# Patient Record
Sex: Female | Born: 1965 | Hispanic: Yes | Marital: Single
Health system: Southern US, Community
[De-identification: ages and names within clinical notes are randomized; demographics above are authoritative.]

---

## 2019-11-21 ENCOUNTER — Ambulatory Visit: Payer: Self-pay | Attending: Internal Medicine

## 2019-11-21 ENCOUNTER — Other Ambulatory Visit: Payer: Self-pay

## 2019-11-21 DIAGNOSIS — Z23 Encounter for immunization: Secondary | ICD-10-CM

## 2019-11-21 NOTE — Progress Notes (Signed)
   Covid-19 Vaccination Clinic  Name:  Vicki Roberson    MRN: [id number] DOB: 1966/05/11  11/21/2019  Ms. Vicki Roberson was observed post Covid-19 immunization for 15 minutes without incident. She was provided with Vaccine Information Sheet and instruction to access the V-Safe system.   Ms. Vicki Roberson was instructed to call 911 with any severe reactions post vaccine: Marland Kitchen Difficulty breathing  . Swelling of face and throat  . A fast heartbeat  . A bad rash all over body  . Dizziness and weakness   Immunizations Administered    Name Date Dose VIS Date Route   Pfizer COVID-19 Vaccine 11/21/2019  9:48 AM 0.3 mL 07/19/2019 Intramuscular   Manufacturer: [employer], [employer]   Lot: [id number]   NDC: [address]-0

## 2019-12-18 ENCOUNTER — Ambulatory Visit: Payer: Self-pay | Attending: Internal Medicine

## 2019-12-18 DIAGNOSIS — Z23 Encounter for immunization: Secondary | ICD-10-CM

## 2019-12-18 NOTE — Progress Notes (Signed)
   Covid-19 Vaccination Clinic  Name:  Vicki Roberson    MRN: [id number] DOB: 1965-08-27  12/18/2019  Ms. Vicki Roberson was observed post Covid-19 immunization for 15 minutes without incident. She was provided with Vaccine Information Sheet and instruction to access the V-Safe system.   Ms. Vicki Roberson was instructed to call 911 with any severe reactions post vaccine: Marland Kitchen Difficulty breathing  . Swelling of face and throat  . A fast heartbeat  . A bad rash all over body  . Dizziness and weakness   Immunizations Administered    Name Date Dose VIS Date Route   Pfizer COVID-19 Vaccine 12/18/2019  9:13 AM 0.3 mL 10/02/2018 Intramuscular   Manufacturer: [employer], [employer]   Lot: [id number]   NDC: [address]-5

## 2020-05-19 ENCOUNTER — Encounter: Payer: Self-pay | Admitting: *Deleted

## 2020-05-19 ENCOUNTER — Other Ambulatory Visit: Payer: Self-pay

## 2020-05-19 ENCOUNTER — Ambulatory Visit
Admission: RE | Admit: 2020-05-19 | Discharge: 2020-05-19 | Disposition: A | Discharge status: Home / Self Care | Payer: Self-pay | Source: Ambulatory Visit | Attending: Oncology | Admitting: Oncology

## 2020-05-19 ENCOUNTER — Ambulatory Visit: Payer: Self-pay | Attending: Oncology | Admitting: *Deleted

## 2020-05-19 VITALS — BP 151/76 | HR 64 | Temp 98.6°F | Ht 63.16 in | Wt 174.4 lb

## 2020-05-19 DIAGNOSIS — Z0000 Encounter for general adult medical examination without abnormal findings: Secondary | ICD-10-CM

## 2020-05-19 NOTE — Patient Instructions (Signed)
Gave patient hand-out, Women Staying Healthy, Active and Well from [facility], with education on breast health, pap smears, heart and colon health. 

## 2020-05-19 NOTE — Progress Notes (Signed)
  Subjective:     Patient ID: Vicki Roberson, female   DOB: 1965-11-15, 54 y.o.   MRN: [id number]  HPI   [facility] Medical History Record - 05/19/20 1029      Breast History   Screening cycle New    Provider ([facility]) [employer]    Initial Mammogram 05/19/20    Last Mammogram Annual    Last Mammogram Date --   12 years ago   Provider (Mammogram)  [facility]    Recent Breast Symptoms None      Breast Cancer History   Breast Cancer History No personal or family history      Previous History of Breast Problems   Breast Surgery or Biopsy None    Breast Implants N/A    BSE Done Monthly      Gynecological/Obstetrical History   LMP --   7 years ago   Is there any chance that the client could be pregnant?  No    Age at menarche 66    Age at menopause 54    PAP smear history --   15 years ago   Provider (PAP) Plans to see primary provider for pap    Age at first live birth nulliparous    DES Exposure Unkown    Cervical, Uterine or Ovarian cancer No    Family history of Cervial, Uterine or Ovarian cancer No    Hysterectomy No    Cervix removed No    Ovaries removed No    Laser/Cryosurgery No    Current method of birth control None    Current method of Estrogen/Hormone replacement None    Smoking history None             Review of Systems     Objective:   Physical Exam Chest:     Breasts:        Right: No swelling, bleeding, inverted nipple, mass, nipple discharge, skin change or tenderness.        Left: No swelling, bleeding, inverted nipple, mass, nipple discharge, skin change or tenderness.  Lymphadenopathy:     Upper Body:     Right upper body: No supraclavicular or axillary adenopathy.     Left upper body: No supraclavicular or axillary adenopathy.        Assessment:     54 year old Hispanic patient female referred to [facility] from [facility] for clinical breast and mammogram.  Claretha Cooper, the interpreter present during the interview  and exam.  Clinical breast exam unremarkable.  Taught self breast awareness.  Patient states she is not prepared to have her pap smear today, but she will get it done at the Clinic in [place].  Patient has been screened for eligibility.  She does not have any insurance, Medicare or Medicaid.  She also meets financial eligibility.   Risk Assessment    Risk Scores      05/19/2020   Last edited by: Scarlett Presto, RN   5-year risk: 0.9 %   Lifetime risk: 6.4 %            Plan:     Screening mammogram ordered.  Will follow up per [facility] protocol.

## 2020-05-22 ENCOUNTER — Other Ambulatory Visit: Payer: Self-pay | Admitting: *Deleted

## 2020-05-22 DIAGNOSIS — N63 Unspecified lump in unspecified breast: Secondary | ICD-10-CM

## 2020-06-04 ENCOUNTER — Ambulatory Visit
Admission: RE | Admit: 2020-06-04 | Discharge: 2020-06-04 | Disposition: A | Discharge status: Home / Self Care | Payer: Self-pay | Source: Ambulatory Visit | Attending: Oncology | Admitting: Oncology

## 2020-06-04 ENCOUNTER — Other Ambulatory Visit: Payer: Self-pay

## 2020-06-04 ENCOUNTER — Other Ambulatory Visit: Payer: Self-pay | Admitting: *Deleted

## 2020-06-04 DIAGNOSIS — N63 Unspecified lump in unspecified breast: Secondary | ICD-10-CM

## 2020-06-10 ENCOUNTER — Ambulatory Visit
Admission: RE | Admit: 2020-06-10 | Discharge: 2020-06-10 | Disposition: A | Discharge status: Home / Self Care | Payer: Self-pay | Source: Ambulatory Visit | Attending: Oncology | Admitting: Oncology

## 2020-06-10 ENCOUNTER — Other Ambulatory Visit: Payer: Self-pay

## 2020-06-10 DIAGNOSIS — N63 Unspecified lump in unspecified breast: Secondary | ICD-10-CM

## 2020-06-10 HISTORY — PX: BREAST BIOPSY: SHX20

## 2020-06-11 LAB — SURGICAL PATHOLOGY

## 2021-09-28 IMAGING — MG DIGITAL DIAGNOSTIC UNILAT LEFT W/ CAD
4 series · 4 of 12 positions shown · non-contrast
Comparison: Previous exam(s).

CLINICAL DATA: 54-year-old female status post ultrasound-guided
biopsy of the left breast.

EXAM:
DIAGNOSTIC LEFT MAMMOGRAM POST ULTRASOUND BIOPSY

[L ML synth-2D]
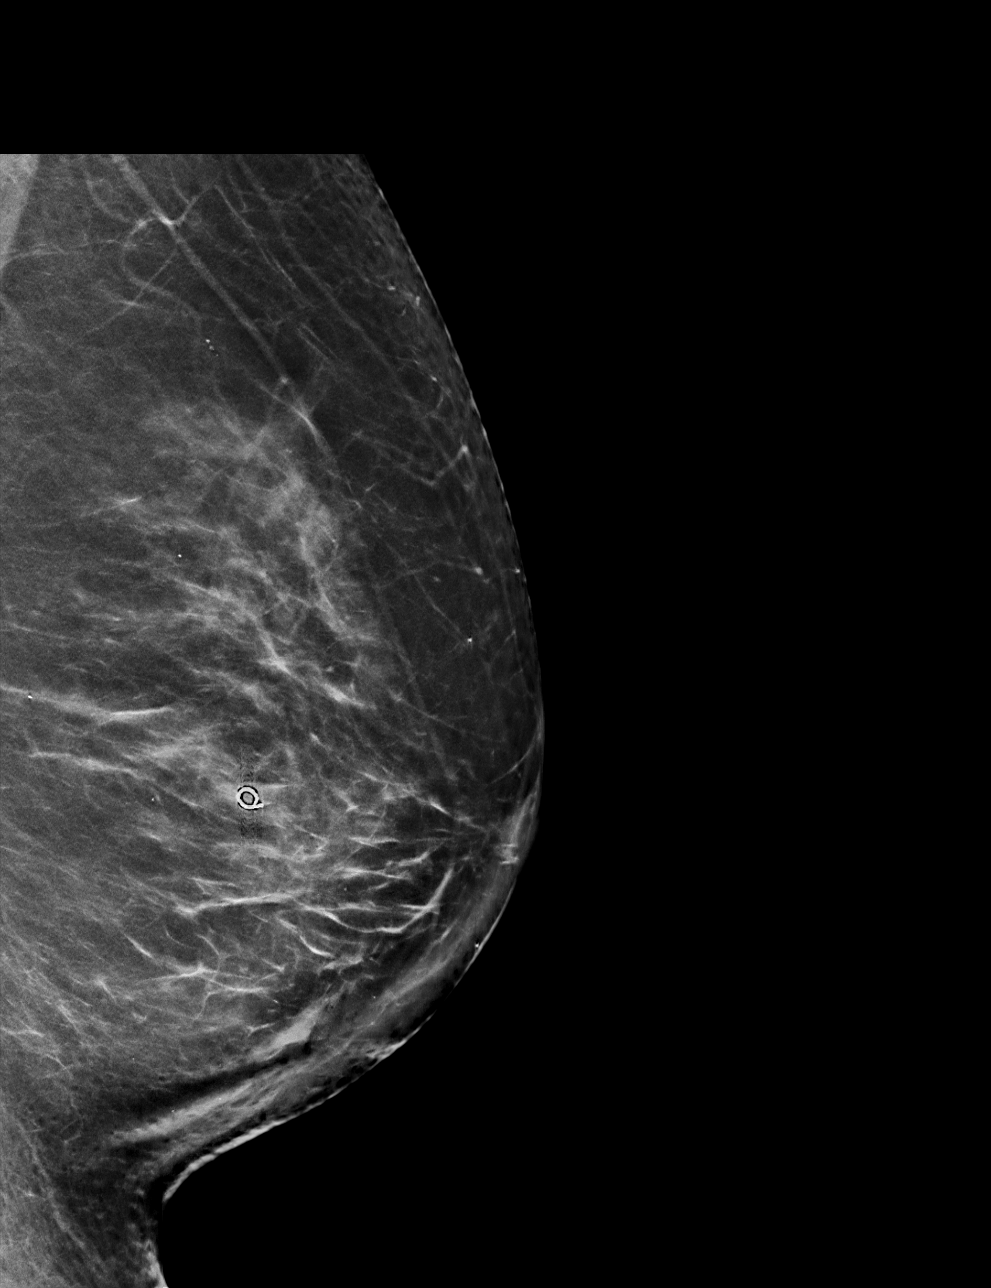

[L CC synth-2D]
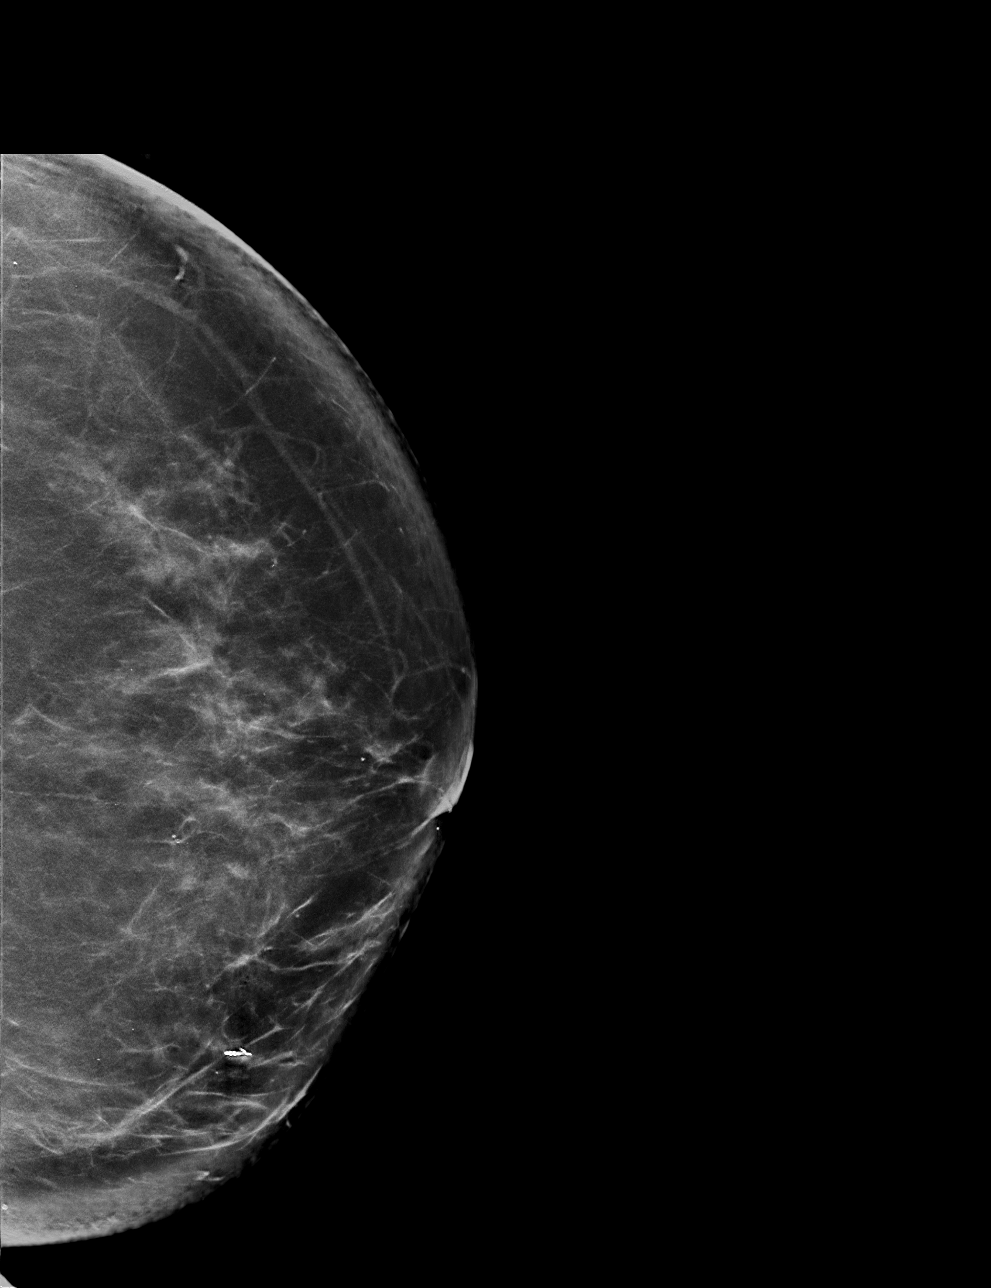

[L ML tomo · tomo slice 43/85.0]
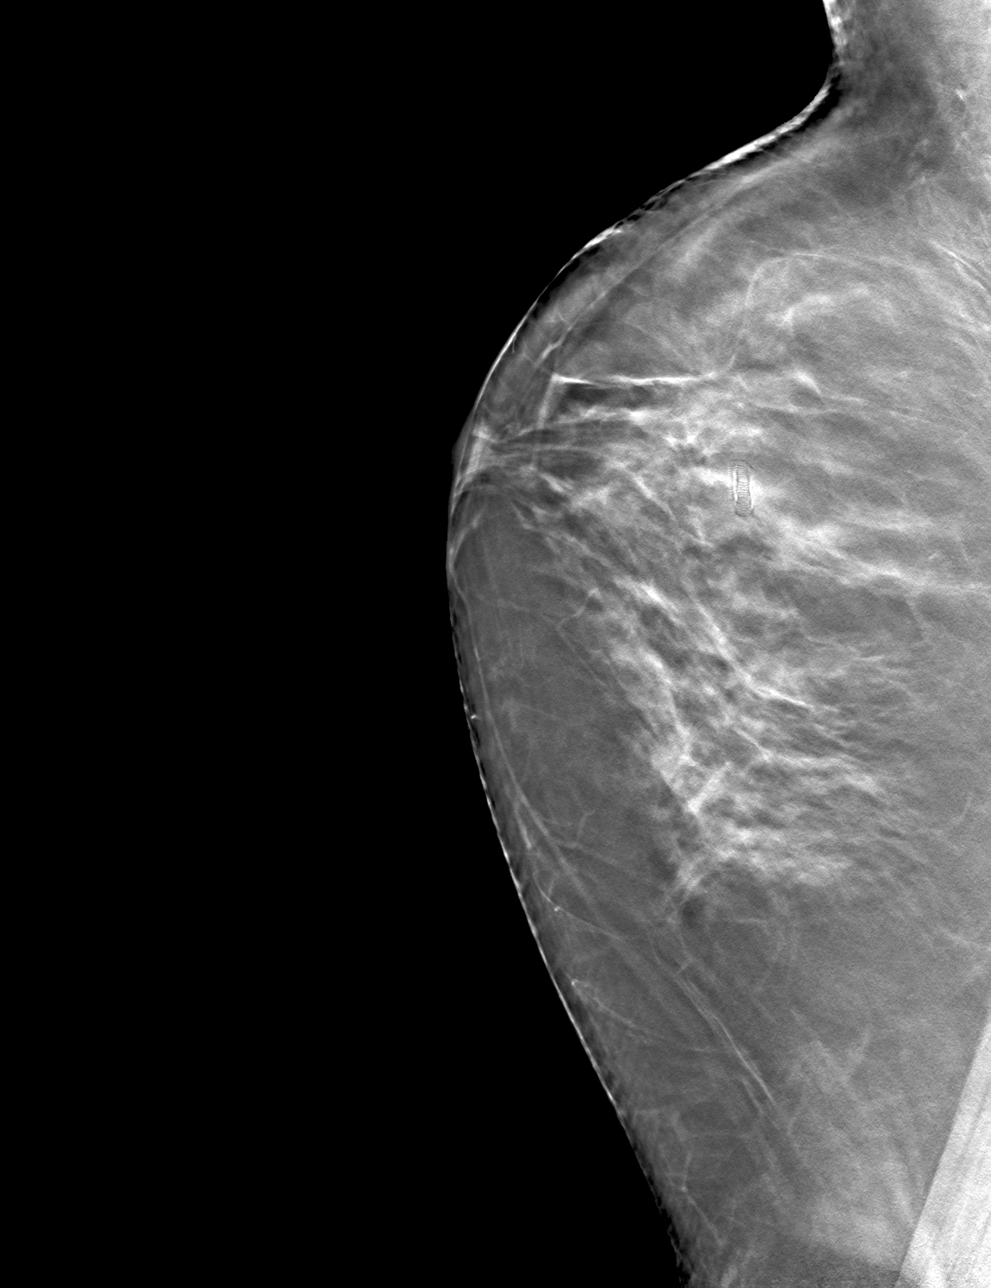

[L CC tomo · tomo slice 47/93.0]
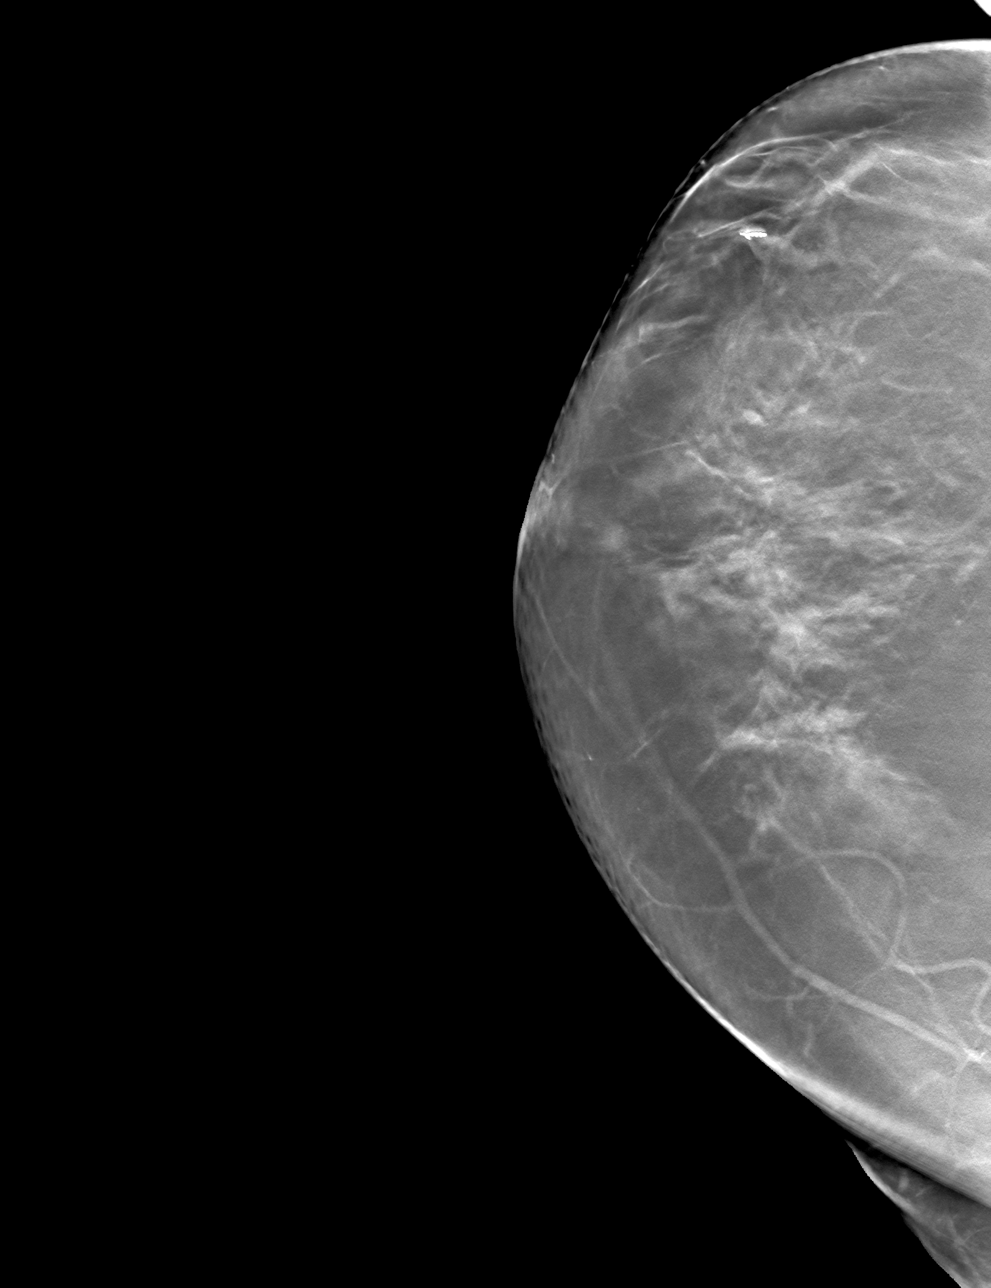

[4 of 12 positions shown; findings below may reference images not displayed]

FINDINGS: Mammographic images were obtained following ultrasound guided biopsy
of the left breast. The biopsy marking clip is in expected position
at the site of biopsy.
IMPRESSION: Appropriate positioning of the Q shaped biopsy marking clip at the
site of biopsy in the medial left breast.

Final Assessment: Post Procedure Mammograms for Marker Placement
